# Patient Record
Sex: Female | Born: 1935 | Race: White | Hispanic: No | Marital: Married | Smoking: Never smoker
Health system: Southern US, Community
[De-identification: ages and names within clinical notes are randomized; demographics above are authoritative.]

## PROBLEM LIST (undated history)

## (undated) DIAGNOSIS — M1990 Unspecified osteoarthritis, unspecified site: Secondary | ICD-10-CM

## (undated) DIAGNOSIS — E079 Disorder of thyroid, unspecified: Secondary | ICD-10-CM

## (undated) DIAGNOSIS — M810 Age-related osteoporosis without current pathological fracture: Secondary | ICD-10-CM

## (undated) HISTORY — PX: TONSILLECTOMY: SUR1361

## (undated) HISTORY — PX: ABDOMINAL HYSTERECTOMY: SHX81

## (undated) HISTORY — PX: BLADDER TUMOR EXCISION: SHX238

---

## 2016-01-15 ENCOUNTER — Encounter (HOSPITAL_BASED_OUTPATIENT_CLINIC_OR_DEPARTMENT_OTHER): Payer: Self-pay | Admitting: Emergency Medicine

## 2016-01-15 ENCOUNTER — Emergency Department (HOSPITAL_BASED_OUTPATIENT_CLINIC_OR_DEPARTMENT_OTHER): Payer: Medicare Other

## 2016-01-15 ENCOUNTER — Emergency Department (HOSPITAL_BASED_OUTPATIENT_CLINIC_OR_DEPARTMENT_OTHER)
Admission: EM | Admit: 2016-01-15 | Discharge: 2016-01-15 | Disposition: A | Discharge status: Home / Self Care | Payer: Medicare Other | Attending: Emergency Medicine | Admitting: Emergency Medicine

## 2016-01-15 DIAGNOSIS — Y999 Unspecified external cause status: Secondary | ICD-10-CM | POA: Diagnosis not present

## 2016-01-15 DIAGNOSIS — Z79899 Other long term (current) drug therapy: Secondary | ICD-10-CM | POA: Diagnosis not present

## 2016-01-15 DIAGNOSIS — S81812A Laceration without foreign body, left lower leg, initial encounter: Secondary | ICD-10-CM | POA: Diagnosis not present

## 2016-01-15 DIAGNOSIS — M1990 Unspecified osteoarthritis, unspecified site: Secondary | ICD-10-CM | POA: Diagnosis not present

## 2016-01-15 DIAGNOSIS — S8992XA Unspecified injury of left lower leg, initial encounter: Secondary | ICD-10-CM | POA: Diagnosis present

## 2016-01-15 DIAGNOSIS — Y939 Activity, unspecified: Secondary | ICD-10-CM | POA: Diagnosis not present

## 2016-01-15 DIAGNOSIS — W19XXXA Unspecified fall, initial encounter: Secondary | ICD-10-CM

## 2016-01-15 DIAGNOSIS — Y929 Unspecified place or not applicable: Secondary | ICD-10-CM | POA: Diagnosis not present

## 2016-01-15 DIAGNOSIS — W1839XA Other fall on same level, initial encounter: Secondary | ICD-10-CM | POA: Diagnosis not present

## 2016-01-15 DIAGNOSIS — M79605 Pain in left leg: Secondary | ICD-10-CM

## 2016-01-15 HISTORY — DX: Disorder of thyroid, unspecified: E07.9

## 2016-01-15 HISTORY — DX: Unspecified osteoarthritis, unspecified site: M19.90

## 2016-01-15 HISTORY — DX: Age-related osteoporosis without current pathological fracture: M81.0

## 2016-01-15 MED ORDER — CEPHALEXIN 500 MG PO CAPS: 500.0000 mg | ORAL_CAPSULE | Freq: Three times a day (TID) | ORAL | Status: AC

## 2016-01-15 MED ORDER — LIDOCAINE HCL (PF) 1 % IJ SOLN: 30.0000 mL | Freq: Once | INTRAMUSCULAR | Status: DC

## 2016-01-15 MED ORDER — TETANUS-DIPHTH-ACELL PERTUSSIS 5-2.5-18.5 LF-MCG/0.5 IM SUSP
0.5000 mL | Freq: Once | INTRAMUSCULAR | Status: AC
Start: 2016-01-15 — End: 2016-01-15
  Administered 2016-01-15: 0.5 mL via INTRAMUSCULAR
  Filled 2016-01-15: qty 0.5

## 2016-01-15 MED ORDER — LIDOCAINE-EPINEPHRINE (PF) 1 %-1:200000 IJ SOLN
INTRAMUSCULAR | Status: AC
  Filled 2016-01-15: qty 30

## 2016-01-15 NOTE — ED Notes (Signed)
Patient transported to X-ray 

## 2016-01-15 NOTE — ED Notes (Signed)
MD at bedside. 

## 2016-01-15 NOTE — Discharge Instructions (Signed)
Medications: Keflex  Treatment: Take Keflex 3 times daily for 1 week. Keep the bandage given today on until this time tomorrow. At this time, wash with warm soapy water and apply bacitracin and clean dressing. Do this daily until you have your staples removed in 10-14 days.  Follow-up: Please see your primary care provider or return to the emergency department in 10-14 days to have your sutures removed. Be aware of any signs of infection including fever, increasing redness, swelling, drainage, streaking from the area. If any of these symptoms develop, please call your doctor or return to the emergency department immediately.   Laceration Care, Adult A laceration is a cut that goes through all of the layers of the skin and into the tissue that is right under the skin. Some lacerations heal on their own. Others need to be closed with stitches (sutures), staples, skin adhesive strips, or skin glue. Proper laceration care minimizes the risk of infection and helps the laceration to heal better. HOW TO CARE FOR YOUR LACERATION If sutures or staples were used:  Keep the wound clean and dry.  If you were given a bandage (dressing), you should change it at least one time per day or as told by your health care provider. You should also change it if it becomes wet or dirty.  Keep the wound completely dry for the first 24 hours or as told by your health care provider. After that time, you may shower or bathe. However, make sure that the wound is not soaked in water until after the sutures or staples have been removed.  Clean the wound one time each day or as told by your health care provider:  Wash the wound with soap and water.  Rinse the wound with water to remove all soap.  Pat the wound dry with a clean towel. Do not rub the wound.  After cleaning the wound, apply a thin layer of antibiotic ointmentas told by your health care provider. This will help to prevent infection and keep the dressing from  sticking to the wound.  Have the sutures or staples removed as told by your health care provider. If skin adhesive strips were used:  Keep the wound clean and dry.  If you were given a bandage (dressing), you should change it at least one time per day or as told by your health care provider. You should also change it if it becomes dirty or wet.  Do not get the skin adhesive strips wet. You may shower or bathe, but be careful to keep the wound dry.  If the wound gets wet, pat it dry with a clean towel. Do not rub the wound.  Skin adhesive strips fall off on their own. You may trim the strips as the wound heals. Do not remove skin adhesive strips that are still stuck to the wound. They will fall off in time. If skin glue was used:  Try to keep the wound dry, but you may briefly wet it in the shower or bath. Do not soak the wound in water, such as by swimming.  After you have showered or bathed, gently pat the wound dry with a clean towel. Do not rub the wound.  Do not do any activities that will make you sweat heavily until the skin glue has fallen off on its own.  Do not apply liquid, cream, or ointment medicine to the wound while the skin glue is in place. Using those may loosen the film before the  wound has healed.  If you were given a bandage (dressing), you should change it at least one time per day or as told by your health care provider. You should also change it if it becomes dirty or wet.  If a dressing is placed over the wound, be careful not to apply tape directly over the skin glue. Doing that may cause the glue to be pulled off before the wound has healed.  Do not pick at the glue. The skin glue usually remains in place for 5-10 days, then it falls off of the skin. General Instructions  Take over-the-counter and prescription medicines only as told by your health care provider.  If you were prescribed an antibiotic medicine or ointment, take or apply it as told by your  doctor. Do not stop using it even if your condition improves.  To help prevent scarring, make sure to cover your wound with sunscreen whenever you are outside after stitches are removed, after adhesive strips are removed, or when glue remains in place and the wound is healed. Make sure to wear a sunscreen of at least 30 SPF.  Do not scratch or pick at the wound.  Keep all follow-up visits as told by your health care provider. This is important.  Check your wound every day for signs of infection. Watch for:  Redness, swelling, or pain.  Fluid, blood, or pus.  Raise (elevate) the injured area above the level of your heart while you are sitting or lying down, if possible. SEEK MEDICAL CARE IF:  You received a tetanus shot and you have swelling, severe pain, redness, or bleeding at the injection site.  You have a fever.  A wound that was closed breaks open.  You notice a bad smell coming from your wound or your dressing.  You notice something coming out of the wound, such as wood or glass.  Your pain is not controlled with medicine.  You have increased redness, swelling, or pain at the site of your wound.  You have fluid, blood, or pus coming from your wound.  You notice a change in the color of your skin near your wound.  You need to change the dressing frequently due to fluid, blood, or pus draining from the wound.  You develop a new rash.  You develop numbness around the wound. SEEK IMMEDIATE MEDICAL CARE IF:  You develop severe swelling around the wound.  Your pain suddenly increases and is severe.  You develop painful lumps near the wound or on skin that is anywhere on your body.  You have a red streak going away from your wound.  The wound is on your hand or foot and you cannot properly move a finger or toe.  The wound is on your hand or foot and you notice that your fingers or toes look pale or bluish.   This information is not intended to replace advice given  to you by your health care provider. Make sure you discuss any questions you have with your health care provider.   Document Released: 06/13/2005 Document Revised: 10/28/2014 Document Reviewed: 06/09/2014 Elsevier Interactive Patient Education [employer]2016 [employer]

## 2016-01-15 NOTE — ED Notes (Signed)
Pt with left lower leg injury, bleeding through gauze at triage. States she lost her balance and fell hitting a chair. Pt a/o denies being on blood thinners or LOC.

## 2016-01-15 NOTE — ED Provider Notes (Signed)
CSN: [id number]     Arrival date & time 01/15/16  1655 History   By signing my name below, I, Linna Darner, attest that this documentation has been prepared under the direction and in the presence of non-physician practitioner, Buel Ream, PA-C. Electronically Signed: Linna Darner, Scribe. 01/15/2016. 5:22 PM.     Chief Complaint  Patient presents with  . Fall  . Leg Injury    The history is provided by the patient. No language interpreter was used.     HPI Comments: Desiree Perkins is a 82 y.o. female brought in by EMS, with PMHx of osteoporosis and arthritis, who presents to the Emergency Department complaining of left lower leg injury s/p mechanical fall occurring about an hour and a half ago. Pt states she bent down to get utensils from a bottom cabinet, stood up, lost her balance, fell, and struck her left lower leg on the leg of a chair. Pt notes she is still bleeding from the wounded area currently but notes her bleeding has improved since the incident. Pt endorses mild pain in the area now but states she has been experiencing sharp "tinges" of pain in her left lower leg intermittently since onset. She reports significant swelling of the injured area as well. Pt endorses pain exacerbation with palpation to the wounded area. Pt also notes some pain in her left foot with palpation to her left foot. She notes that she was able to ambulate to the EMS van PTA and did not experience much pain with ambulating. She did not clean the wound PTA and states she tried to bandage the area, but the bandage was too irritating. Pt is unsure of her tetanus status. She does not use blood thinners. Her last PO was between 12PM and 1PM. She denies neuro deficits, numbness, dizziness, lightheadedness, abdominal pain, nausea, vomiting, chest pain, SOB, syncope, dysuria, or any other associated symptoms.  Past Medical History  Diagnosis Date  . Osteoporosis   . Thyroid disease   . Arthritis    Past Surgical  History  Procedure Laterality Date  . Bladder tumor excision    . Cesarean section    . Abdominal hysterectomy    . Tonsillectomy     No family history on file. Social History  Substance Use Topics  . Smoking status: Never Smoker   . Smokeless tobacco: None  . Alcohol Use: No   OB History    No data available     Review of Systems  Respiratory: Negative for shortness of breath.   Cardiovascular: Negative for chest pain.  Gastrointestinal: Negative for nausea, vomiting and abdominal pain.  Genitourinary: Negative for dysuria.  Musculoskeletal: Positive for myalgias (left lower leg, left foot) and joint swelling (left lower leg). Negative for back pain.  Skin: Positive for wound (laceration left lower leg). Negative for rash.  Neurological: Negative for dizziness, syncope, light-headedness and numbness.       Negative for sensation loss.  Psychiatric/Behavioral: The patient is not nervous/anxious.     Allergies  Review of patient's allergies indicates no known allergies.  Home Medications   Prior to Admission medications   Medication Sig Start Date End Date Taking? Authorizing Provider  diclofenac sodium (VOLTAREN) 1 % GEL Apply topically 4 (four) times daily.   Yes Historical Provider, MD  levothyroxine (SYNTHROID, LEVOTHROID) 50 MCG tablet Take 50 mcg by mouth daily before breakfast.   Yes Historical Provider, MD   BP 183/75 mmHg  Pulse 84  Temp(Src) 98.2 F (36.8 C)  Resp 20  Ht 5\' 5"  (1.651 m)  Wt 170 lb (77.111 kg)  BMI 28.29 kg/m2  SpO2 97% Physical Exam  Constitutional: She appears well-developed and well-nourished. No distress.  HENT:  Head: Normocephalic and atraumatic.  Mouth/Throat: Oropharynx is clear and moist. No oropharyngeal exudate.  Eyes: Conjunctivae are normal. Pupils are equal, round, and reactive to light. Right eye exhibits no discharge. Left eye exhibits no discharge. No scleral icterus.  Neck: Normal range of motion. Neck supple. No  thyromegaly present.  Cardiovascular: Normal rate, regular rhythm, normal heart sounds and intact distal pulses.  Exam reveals no gallop and no friction rub.   No murmur heard. Pulmonary/Chest: Effort normal and breath sounds normal. No stridor. No respiratory distress. She has no wheezes. She has no rales.  Abdominal: Soft. Bowel sounds are normal. She exhibits no distension. There is no tenderness. There is no rebound and no guarding.  Musculoskeletal: She exhibits no edema.  Tenderness about dorsal left foot, bilateral malleoli, and anterior lower leg. ~12 cm laceration to lateral distal lower leg, proximal to ankle 10 x 5 x 2 (height) cm hematoma to lateral left lower leg proximal to ankle.  Lymphadenopathy:    She has no cervical adenopathy.  Neurological: She is alert. Coordination normal.  5/5 strength in bilateral lower extremities. Normal sensation of left lower extremity.  Skin: Skin is warm and dry. No rash noted. She is not diaphoretic. No pallor.  Psychiatric: She has a normal mood and affect.  Nursing note and vitals reviewed.   ED Course  .Marland Kitchen.Laceration Repair Date/Time: 01/16/2016 1:08 AM Performed by: Emi HolesLAW, Stefania Goulart M Authorized by: Emi HolesLAW, Yassir Enis M Consent: Verbal consent obtained. Consent given by: patient Patient identity confirmed: verbally with patient Body area: lower extremity Location details: left lower leg Laceration length: 12 cm Foreign bodies: no foreign bodies Tendon involvement: none Nerve involvement: none Vascular damage: no Local anesthetic: lidocaine 1% with epinephrine Anesthetic total: 11 ml Patient sedated: no Irrigation solution: saline Irrigation method: syringe Amount of cleaning: extensive Debridement: none Degree of undermining: none Wound skin closure material used: staples. Number of sutures: 19 (staples) Suture technique: staples and steri strips. Approximation: close Approximation difficulty: complex (due to very thin  skin) Dressing: gauze roll and antibiotic ointment Patient tolerance: Patient tolerated the procedure well with no immediate complications   (including critical care time)  DIAGNOSTIC STUDIES: Oxygen Saturation is 97% on RA, normal by my interpretation.    COORDINATION OF CARE: 5:22 PM Discussed treatment plan with pt at bedside and pt agreed to plan.  Labs Review Labs Reviewed - No data to display  Imaging Review No results found. I have personally reviewed and evaluated these images and lab results as part of my medical decision-making.   EKG Interpretation None      MDM   Tetanus updated in ED. Laceration occurred < 12 hours prior to repair. Laceration repaired with staples following hematoma evacuation and extensive syringe irrigation. Discussed laceration care with pt and answered questions. Pt to f-u for staple removal in 10-14 days and wound check sooner should there be signs of dehiscence or infection. Patient discharged with Keflex for prophylaxis. Pt is hemodynamically stable with no complaints prior to dc.  Patient also evaluated by Dr. Fredderick PhenixBelfi who guided the patient's management and is in agreement with plan.   Final diagnoses:  Fall  Left leg pain  Leg laceration, left, initial encounter   I personally performed the services described in this documentation, which was scribed in  my presence. The recorded information has been reviewed and is accurate.   [address] PA-C 01/16/16 0110   Rolan Bucco, MD 01/18/16 [phone]

## 2019-11-28 ENCOUNTER — Encounter (HOSPITAL_BASED_OUTPATIENT_CLINIC_OR_DEPARTMENT_OTHER): Payer: Self-pay | Admitting: *Deleted

## 2019-11-28 ENCOUNTER — Other Ambulatory Visit: Payer: Self-pay

## 2019-11-28 ENCOUNTER — Emergency Department (HOSPITAL_BASED_OUTPATIENT_CLINIC_OR_DEPARTMENT_OTHER): Payer: Medicare Other

## 2019-11-28 ENCOUNTER — Emergency Department (HOSPITAL_BASED_OUTPATIENT_CLINIC_OR_DEPARTMENT_OTHER)
Admission: EM | Admit: 2019-11-28 | Discharge: 2019-11-28 | Disposition: A | Discharge status: Home / Self Care | Payer: Medicare Other | Attending: Emergency Medicine | Admitting: Emergency Medicine

## 2019-11-28 DIAGNOSIS — Z79899 Other long term (current) drug therapy: Secondary | ICD-10-CM | POA: Insufficient documentation

## 2019-11-28 DIAGNOSIS — Y999 Unspecified external cause status: Secondary | ICD-10-CM | POA: Diagnosis not present

## 2019-11-28 DIAGNOSIS — W01198A Fall on same level from slipping, tripping and stumbling with subsequent striking against other object, initial encounter: Secondary | ICD-10-CM | POA: Insufficient documentation

## 2019-11-28 DIAGNOSIS — Y9389 Activity, other specified: Secondary | ICD-10-CM | POA: Diagnosis not present

## 2019-11-28 DIAGNOSIS — Y92003 Bedroom of unspecified non-institutional (private) residence as the place of occurrence of the external cause: Secondary | ICD-10-CM | POA: Diagnosis not present

## 2019-11-28 DIAGNOSIS — S51011A Laceration without foreign body of right elbow, initial encounter: Secondary | ICD-10-CM | POA: Insufficient documentation

## 2019-11-28 DIAGNOSIS — E079 Disorder of thyroid, unspecified: Secondary | ICD-10-CM | POA: Insufficient documentation

## 2019-11-28 DIAGNOSIS — S41011A Laceration without foreign body of right shoulder, initial encounter: Secondary | ICD-10-CM | POA: Diagnosis not present

## 2019-11-28 DIAGNOSIS — W19XXXA Unspecified fall, initial encounter: Secondary | ICD-10-CM

## 2019-11-28 DIAGNOSIS — S0003XA Contusion of scalp, initial encounter: Secondary | ICD-10-CM | POA: Insufficient documentation

## 2019-11-28 DIAGNOSIS — T148XXA Other injury of unspecified body region, initial encounter: Secondary | ICD-10-CM

## 2019-11-28 DIAGNOSIS — S0990XA Unspecified injury of head, initial encounter: Secondary | ICD-10-CM | POA: Diagnosis present

## 2019-11-28 MED ORDER — ACETAMINOPHEN 325 MG PO TABS
650.0000 mg | ORAL_TABLET | Freq: Once | ORAL | Status: AC
  Administered 2019-11-28: 650 mg via ORAL
  Filled 2019-11-28: qty 2

## 2019-11-28 NOTE — ED Provider Notes (Signed)
[facility] EMERGENCY DEPARTMENT Provider Note   CSN: [id number] Arrival date & time: 11/28/19  1047     History Chief Complaint  Patient presents with  . Fall    Desiree Perkins is a 86 y.o. female.  The history is provided by the patient, medical records and a relative.  Fall This is a new problem. The current episode started 6 to 12 hours ago. The problem occurs rarely. The problem has been resolved. Associated symptoms include headaches. Pertinent negatives include no chest pain, no abdominal pain and no shortness of breath. Nothing aggravates the symptoms. Nothing relieves the symptoms. She has tried nothing for the symptoms. The treatment provided no relief.       Past Medical History:  Diagnosis Date  . Arthritis   . Osteoporosis   . Thyroid disease     There are no problems to display for this patient.   Past Surgical History:  Procedure Laterality Date  . ABDOMINAL HYSTERECTOMY    . BLADDER TUMOR EXCISION    . CESAREAN SECTION    . TONSILLECTOMY       OB History   No obstetric history on file.     No family history on file.  Social History   Tobacco Use  . Smoking status: Never Smoker  Substance Use Topics  . Alcohol use: No  . Drug use: No    Home Medications Prior to Admission medications   Medication Sig Start Date End Date Taking? Authorizing Provider  cephALEXin (KEFLEX) 500 MG capsule Take 1 capsule (500 mg total) by mouth 3 (three) times daily. 01/15/16   Emi Holes, PA-C  diclofenac sodium (VOLTAREN) 1 % GEL Apply topically 4 (four) times daily.    [provider]  levothyroxine (SYNTHROID, LEVOTHROID) 50 MCG tablet Take 50 mcg by mouth daily before breakfast.    [provider]    Allergies    Patient has no known allergies.  Review of Systems   Review of Systems  Constitutional: Negative for activity change, chills, diaphoresis, fatigue and fever.  HENT: Negative for congestion and rhinorrhea.     Eyes: Negative for visual disturbance.  Respiratory: Negative for cough, chest tightness, shortness of breath and stridor.   Cardiovascular: Negative for chest pain, palpitations and leg swelling.  Gastrointestinal: Negative for abdominal distention, abdominal pain, constipation, diarrhea, nausea and vomiting.  Genitourinary: Negative for difficulty urinating, dysuria, flank pain, frequency, hematuria, menstrual problem, pelvic pain, vaginal bleeding and vaginal discharge.  Musculoskeletal: Negative for back pain, neck pain and neck stiffness.  Skin: Positive for wound. Negative for rash.  Neurological: Positive for headaches. Negative for dizziness, weakness, light-headedness and numbness.  Psychiatric/Behavioral: Negative for agitation and confusion.  All other systems reviewed and are negative.   Physical Exam Updated Vital Signs BP (!) 150/69   Pulse 70   Temp 99 F (37.2 C) (Oral)   Resp 16   Ht 5\' 2"  (1.575 m)   SpO2 100%   BMI 31.09 kg/m   Physical Exam Vitals and nursing note reviewed.  Constitutional:      General: She is not in acute distress.    Appearance: She is well-developed. She is not ill-appearing, toxic-appearing or diaphoretic.  HENT:     Head:      Right Ear: External ear normal.     Left Ear: External ear normal.     Nose: Nose normal. No congestion or rhinorrhea.     Mouth/Throat:     Mouth: Mucous membranes  are moist.     Pharynx: No oropharyngeal exudate or posterior oropharyngeal erythema.  Eyes:     Conjunctiva/sclera: Conjunctivae normal.     Pupils: Pupils are equal, round, and reactive to light.  Cardiovascular:     Rate and Rhythm: Normal rate.     Pulses: Normal pulses.     Heart sounds: No murmur.  Pulmonary:     Effort: Pulmonary effort is normal. No respiratory distress.     Breath sounds: No stridor. No wheezing, rhonchi or rales.  Chest:     Chest wall: No tenderness.  Abdominal:     General: Abdomen is flat. There is no  distension.     Tenderness: There is no abdominal tenderness. There is no right CVA tenderness, left CVA tenderness or rebound.  Musculoskeletal:        General: Tenderness present.       Arms:     Cervical back: Normal range of motion and neck supple. No tenderness.       Back:     Right lower leg: No edema.     Left lower leg: No edema.  Skin:    General: Skin is warm.     Capillary Refill: Capillary refill takes less than 2 seconds.     Coloration: Skin is not pale.     Findings: No erythema or rash.  Neurological:     General: No focal deficit present.     Mental Status: She is alert and oriented to person, place, and time.     Cranial Nerves: No cranial nerve deficit.     Sensory: No sensory deficit.     Motor: No weakness or abnormal muscle tone.     Coordination: Coordination normal.     Deep Tendon Reflexes: Reflexes are normal and symmetric.  Psychiatric:        Mood and Affect: Mood normal.     ED Results / Procedures / Treatments   Labs (all labs ordered are listed, but only abnormal results are displayed) Labs Reviewed - No data to display  EKG None  Radiology DG Chest 2 View  Result Date: 11/28/2019 CLINICAL DATA:  Fall, right upper back/scapula pain EXAM: CHEST - 2 VIEW COMPARISON:  01/19/2017 FINDINGS: Right-sided chest port terminates at the level of the superior cavoatrial junction. The heart size and mediastinal contours are stable. Atherosclerotic calcification of the aortic knob. No focal airspace consolidation, pleural effusion, or pneumothorax. The visualized skeletal structures are unremarkable. IMPRESSION: No active cardiopulmonary disease. Electronically Signed   By: Duanne Guess D.O.   On: 11/28/2019 13:14   CT Head Wo Contrast  Result Date: 11/28/2019 CLINICAL DATA:  Posttraumatic headache after fall. EXAM: CT HEAD WITHOUT CONTRAST TECHNIQUE: Contiguous axial images were obtained from the base of the skull through the vertex without intravenous  contrast. COMPARISON:  None. FINDINGS: Brain: Mild diffuse cortical atrophy is noted. Mild chronic ischemic white matter disease is noted. No mass effect or midline shift is noted. Ventricular size is within normal limits. There is no evidence of mass lesion, hemorrhage or acute infarction. Vascular: No hyperdense vessel or unexpected calcification. Skull: Normal. Negative for fracture or focal lesion. Sinuses/Orbits: No acute finding. Other: None. IMPRESSION: Mild diffuse cortical atrophy. Mild chronic ischemic white matter disease. No acute intracranial abnormality seen. Electronically Signed   By: Lupita Raider M.D.   On: 11/28/2019 12:09    Procedures Procedures (including critical care time)  Medications Ordered in ED Medications  acetaminophen (TYLENOL) tablet 650  mg (650 mg Oral Given 11/28/19 1551)    ED Course  I have reviewed the triage vital signs and the nursing notes.  Pertinent labs & imaging results that were available during my care of the patient were reviewed by me and considered in my medical decision making (see chart for details).    MDM Rules/Calculators/A&P                      Desiree Perkins is a 86 y.o. female with a past medical history significant for thyroid disease and osteoporosis who presents with a fall.  According to patient, she had a fall earlier today where she hit the back of her head on a door frame and then hit her right elbow and right scapular area on the ground.  He did not lose consciousness.  She reports moderate pain.  She reports no significant nausea, vomiting, or vision changes.  She reports that she had a mechanical fall while she was reaching for the door and her dresser.  She denies any neck pain or neck stiffness.  Denies any extremity complaints otherwise.  She reports some bleeding from the abrasion and skin tear on her elbow and back.  She denies any pain in her low back or mid back.  No other complaints.  No recent medication changes.  On  exam, lungs are clear and chest is nontender.  Patient has tenderness near a skin tear on her back.  She reports her tetanus is up-to-date.  Symmetric grip strength, sensation, and pulses in upper extremities.  Small skin tear on her right elbow that is hemostatic.  No focal neurologic deficits.  Small bump on the back of her head with no laceration or bleeding.  Patient otherwise well-appearing.  No neck tenderness.  Had a shared decision-making conversation with patient and son and we agreed to get CT head, chest x-ray.  Imaging was reassuring.  No evidence of bleeding or fractures.  Patient will follow up with PCP and understood return precautions.  Patient discharged in good condition.    Final Clinical Impression(s) / ED Diagnoses Final diagnoses:  Fall, initial encounter  Abrasion  Multiple skin tears    Rx / DC Orders ED Discharge Orders    None     Clinical Impression: 1. Fall, initial encounter   2. Abrasion   3. Multiple skin tears     Disposition: Discharge  Condition: Good  I have discussed the results, Dx and Tx plan with the pt(& family if present). He/she/they expressed understanding and agree(s) with the plan. Discharge instructions discussed at great length. Strict return precautions discussed and pt &/or family have verbalized understanding of the instructions. No further questions at time of discharge.    Discharge Medication List as of 11/28/2019  4:30 PM      Follow Up: Karleen Hampshire., MD [address] [place] [address] [phone]     [facility] [address] [id number] [place] [facility] [phone]       Melat Wrisley, Gwenyth Allegra, MD 11/28/19 2006

## 2019-11-28 NOTE — Discharge Instructions (Signed)
Your imaging today was overall reassuring with no evidence of fracture, dislocation, or intracranial bleed.  I suspect there may be a mild concussion given the headache but her neurologic exam otherwise is reassuring.  There were several skin tears and abrasions which were cleaned and dressed.  The chest x-ray did not show any scapular, rib, or lung injury.  Please rest and stay hydrated and follow-up with your primary doctor.  If any symptoms change or worsen, please return to the nearest emergency department.

## 2020-11-24 IMAGING — DX DG CHEST 2V
2 series · 2 of 2 positions shown · non-contrast
Comparison: 01/19/2017

CLINICAL DATA: Fall, right upper back/scapula pain

EXAM:
CHEST - 2 VIEW

[chest pa]
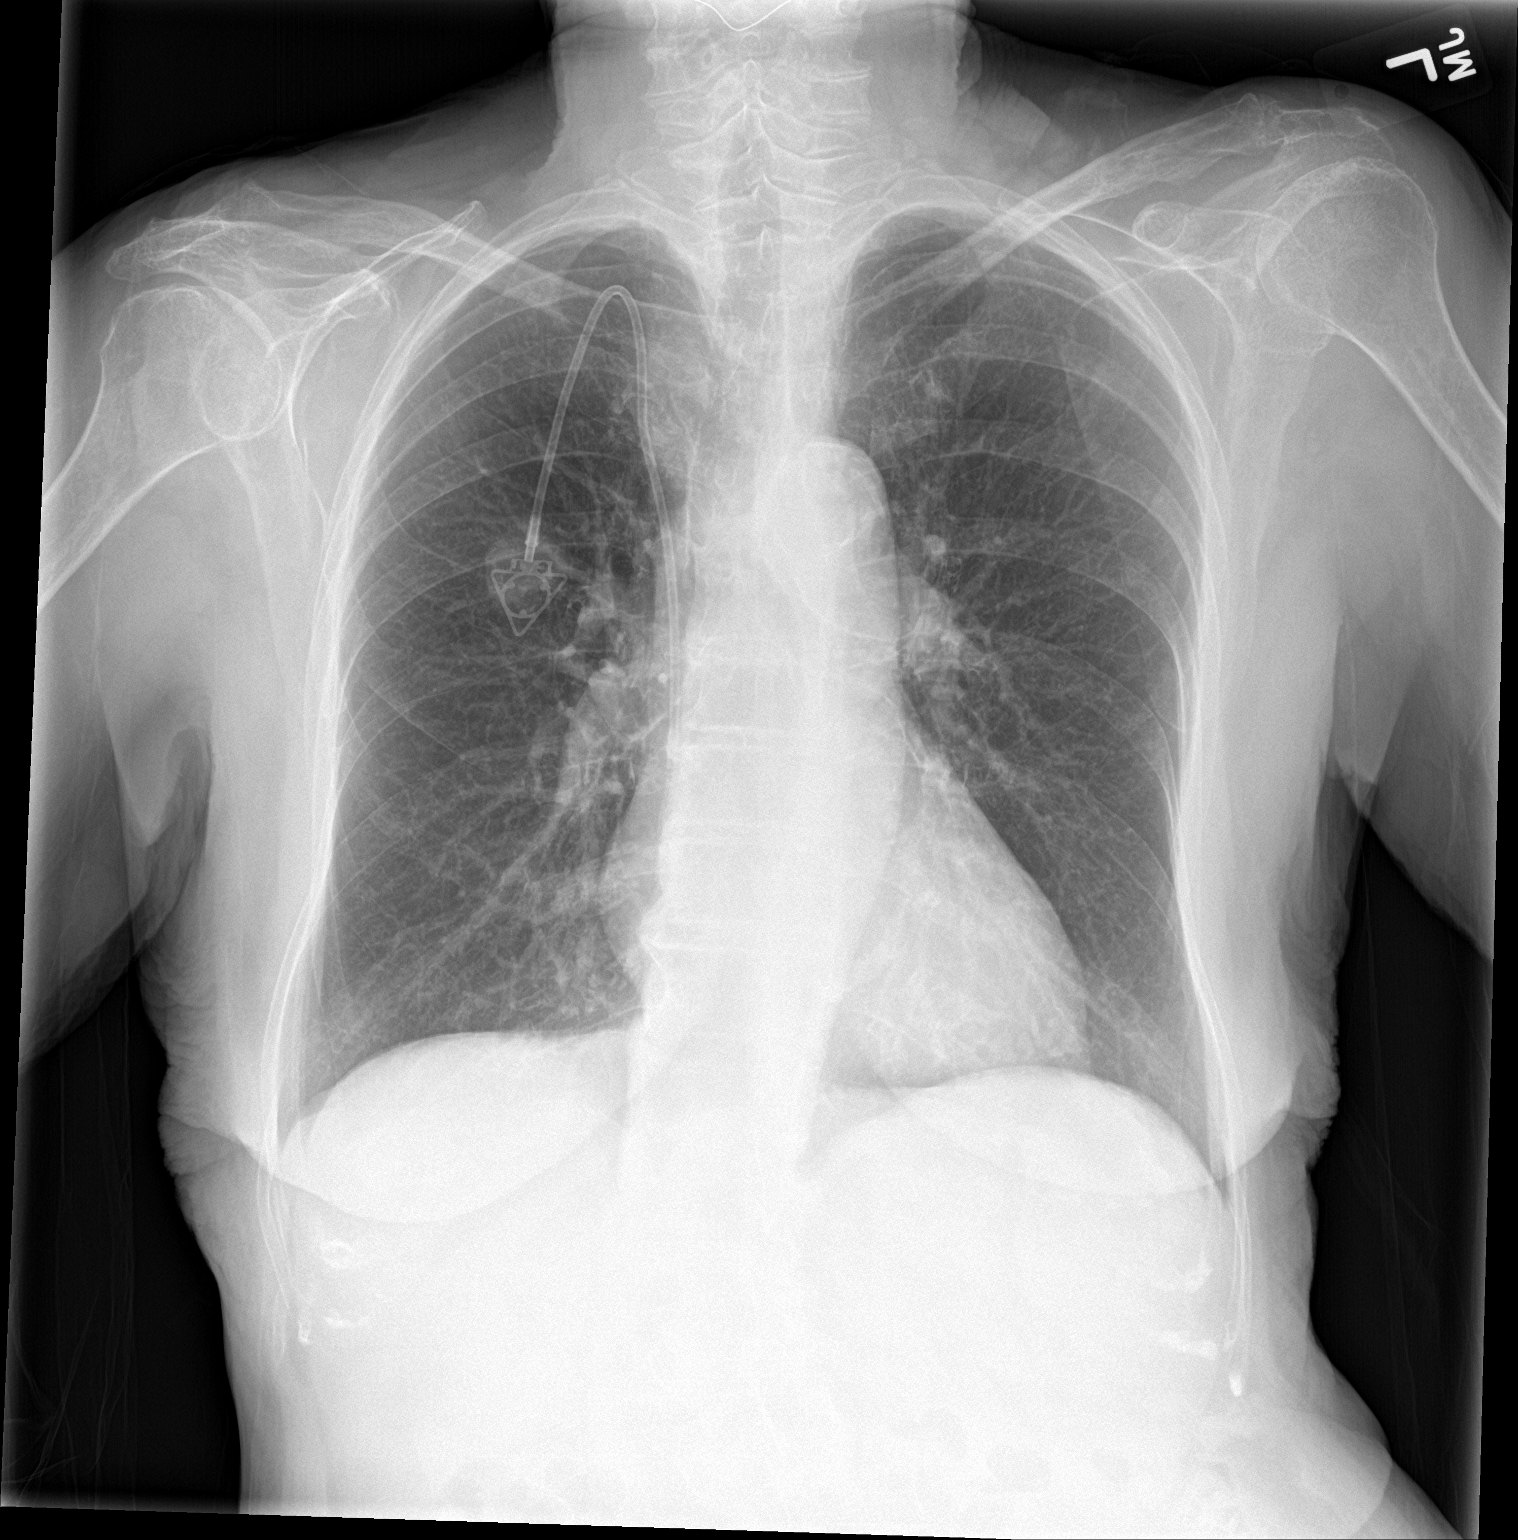

[chest lat]
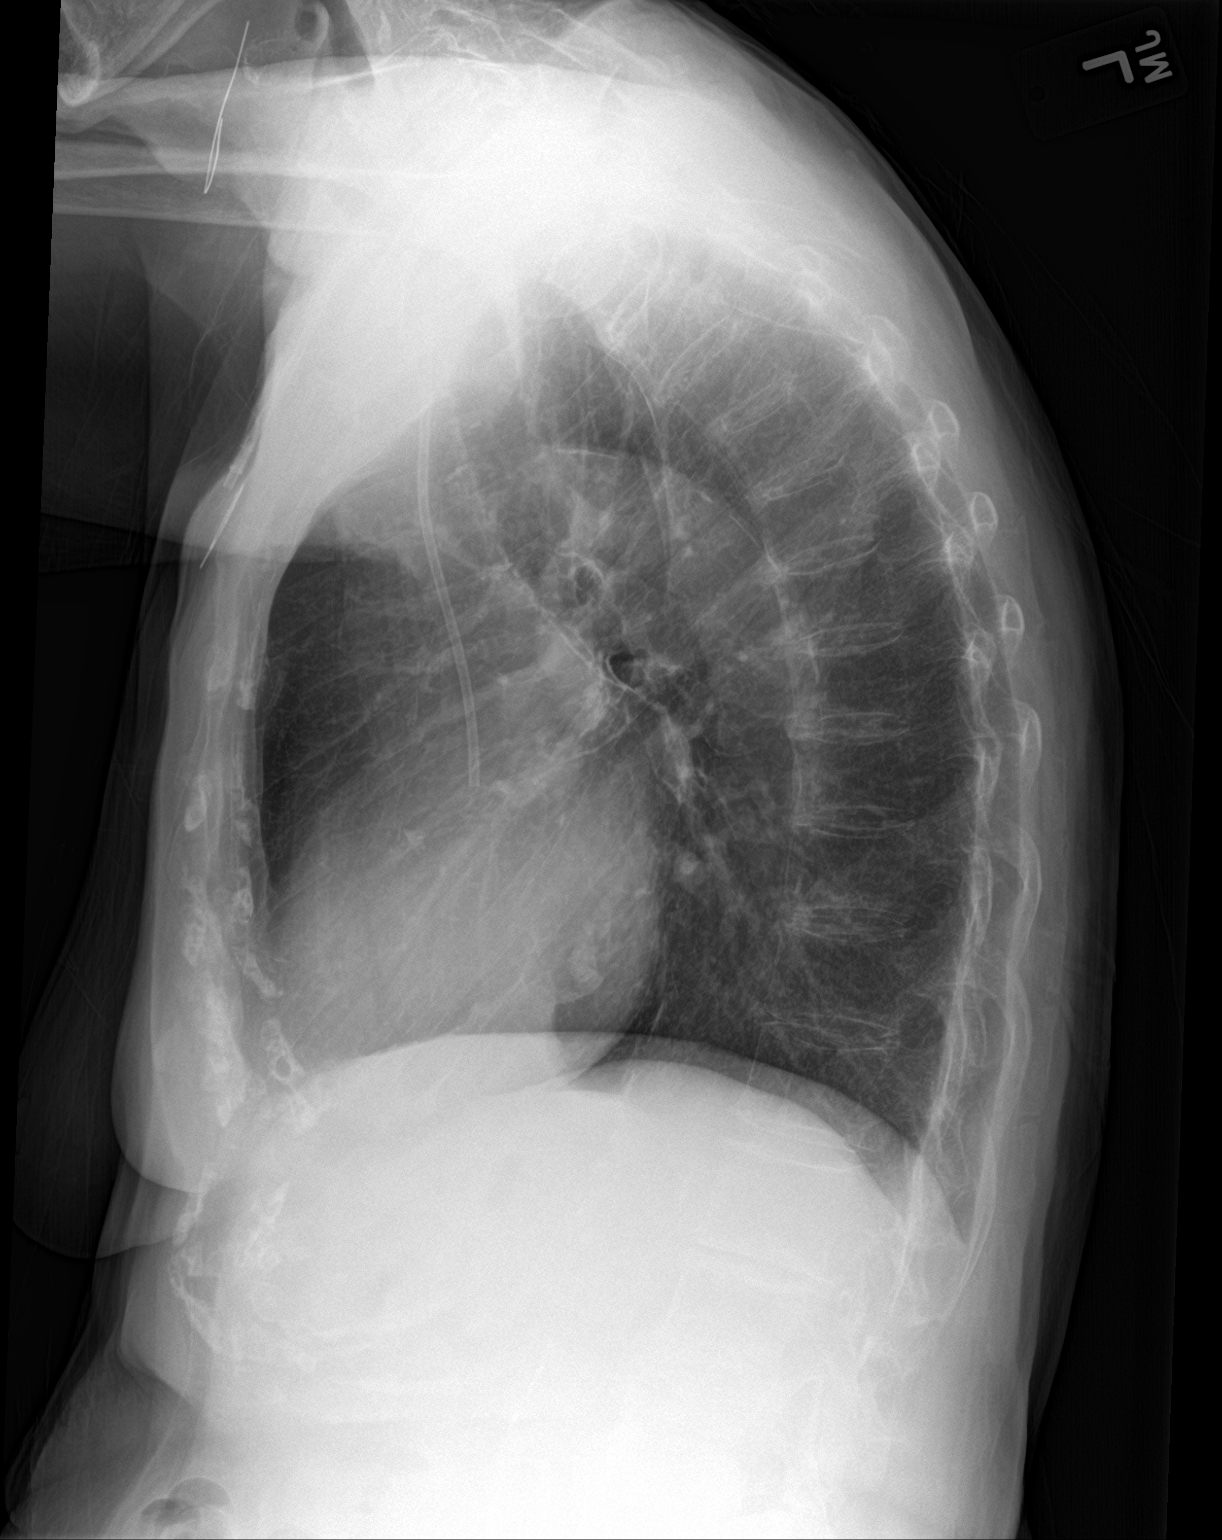

[2 of 2 positions shown; findings below may reference images not displayed]

FINDINGS: Right-sided chest port terminates at the level of the superior
cavoatrial junction. The heart size and mediastinal contours are
stable. Atherosclerotic calcification of the aortic knob. No focal
airspace consolidation, pleural effusion, or pneumothorax. The
visualized skeletal structures are unremarkable.
IMPRESSION: No active cardiopulmonary disease.

## 2020-11-24 IMAGING — CT CT HEAD W/O CM
3 series · 15 of 47 positions shown, 18 images · non-contrast
Comparison: None.

CLINICAL DATA: Posttraumatic headache after fall.

EXAM:
CT HEAD WITHOUT CONTRAST
TECHNIQUE: Contiguous axial images were obtained from the base of the skull
through the vertex without intravenous contrast.

[Series 2: head wo · axial · 0.43mm/px · z∈[-154,-29]mm · 9 of 31 slices shown, 12 images]
[im 3/31  brain]
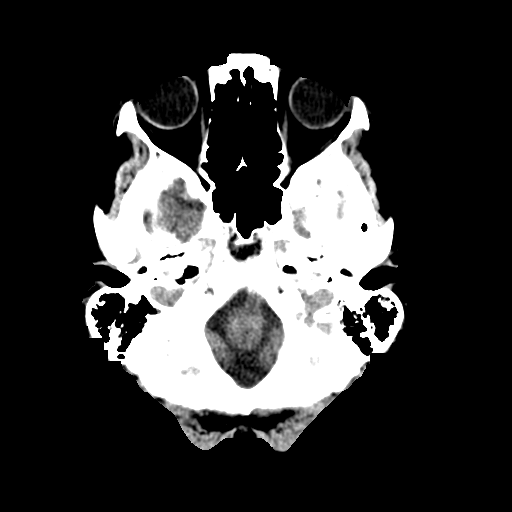
[im 3/31  bone]
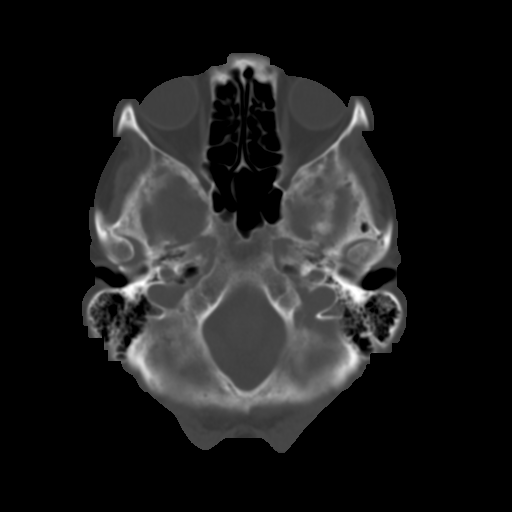
[im 6/31  brain]
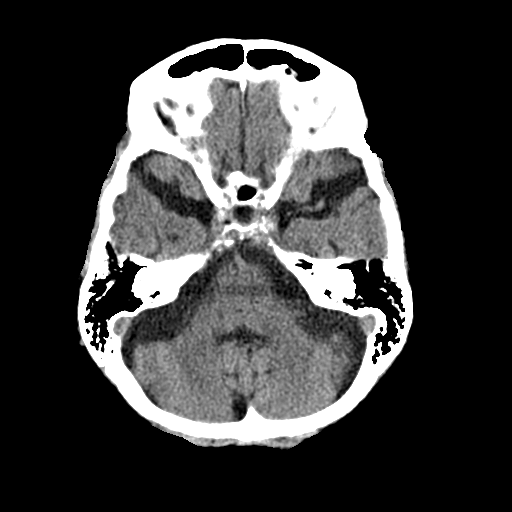
[im 9/31  brain]
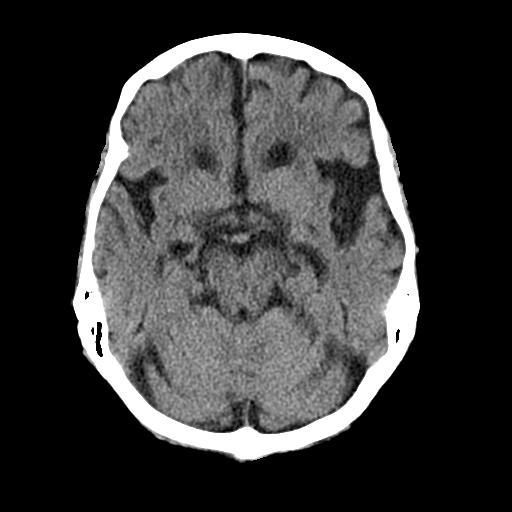
[im 12/31  brain]
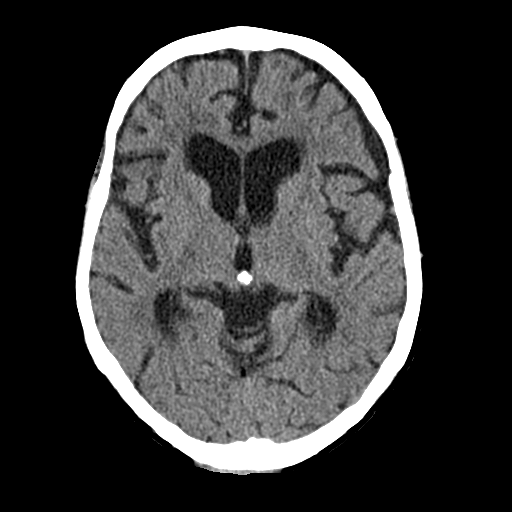
[im 16/31  brain]
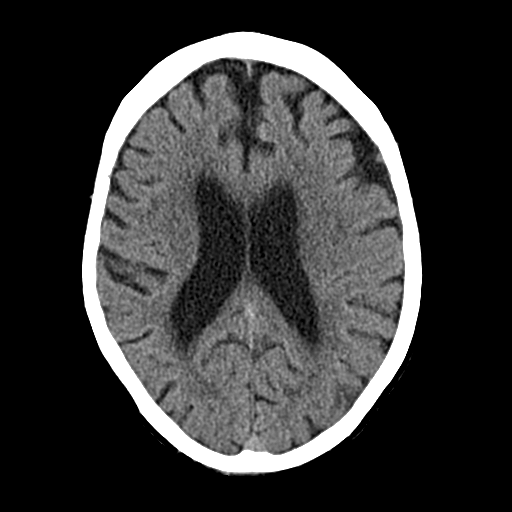
[im 16/31  bone]
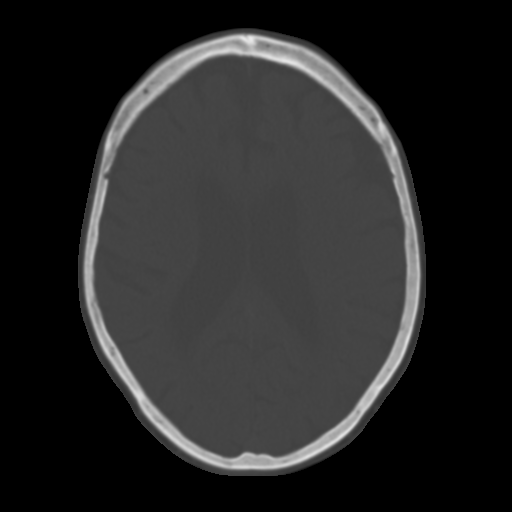
[im 19/31  brain]
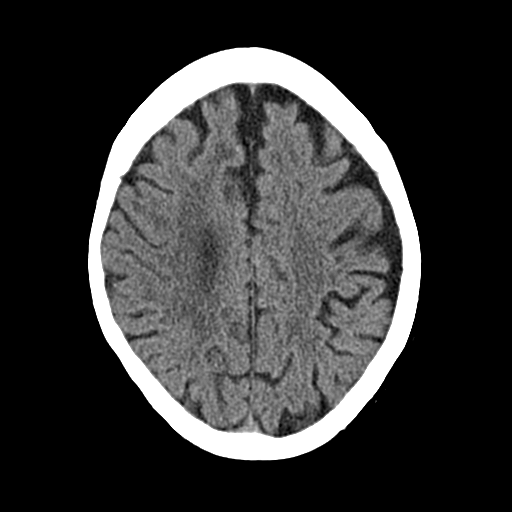
[im 22/31  brain]
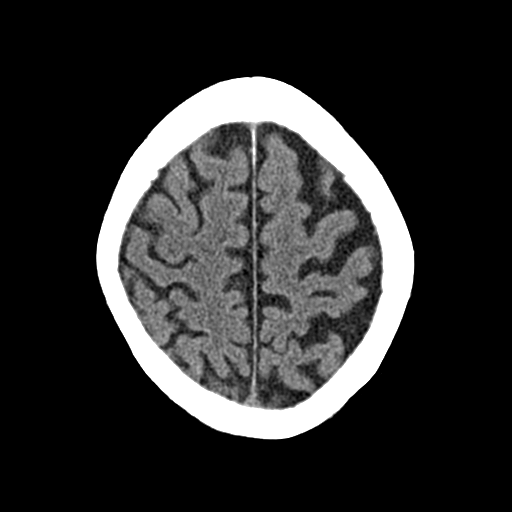
[im 25/31  brain]
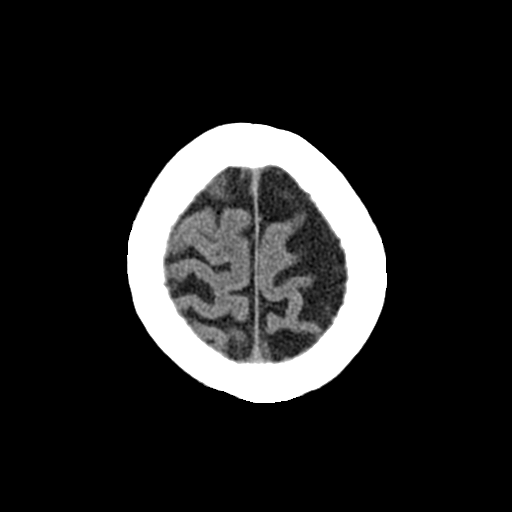
[im 28/31  brain]
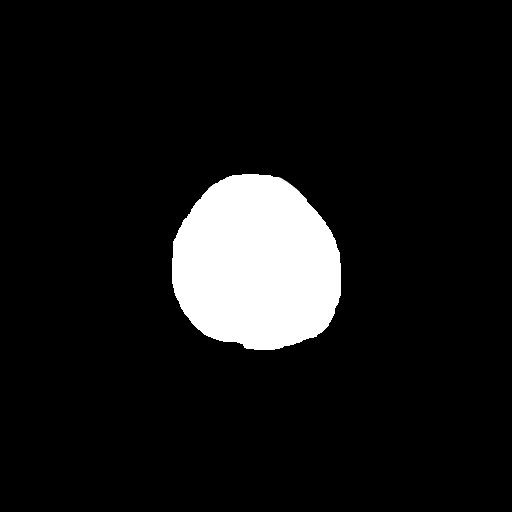
[im 28/31  bone]
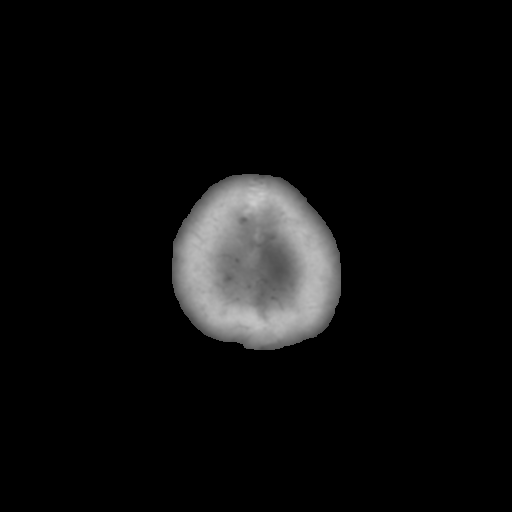

[Series 4: cor soft · coronal · 0.32mm/px · 3 of 70 slices shown]
[im 24/70  brain]
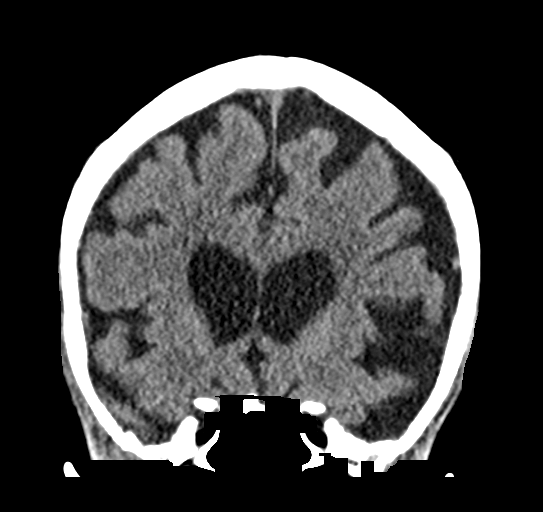
[im 31/70  brain]
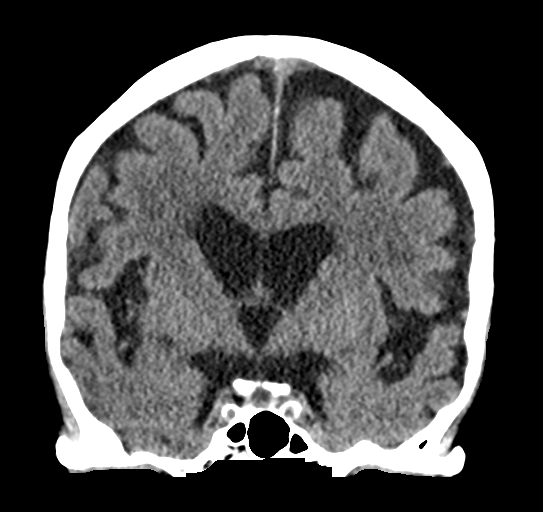
[im 39/70  brain]
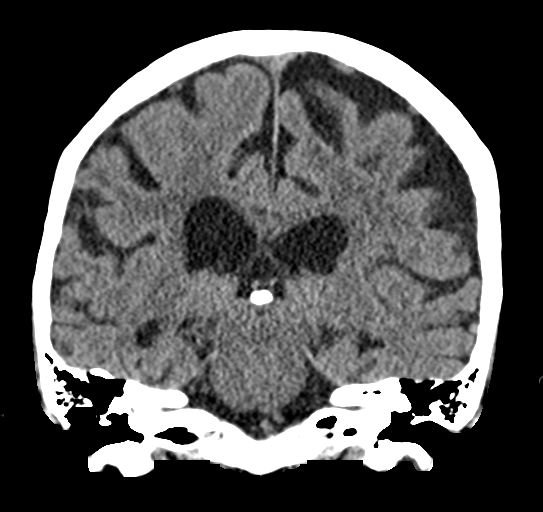

[Series 5: sag soft · sagittal · 0.32mm/px · 3 of 55 slices shown]
[im 19/55  brain]
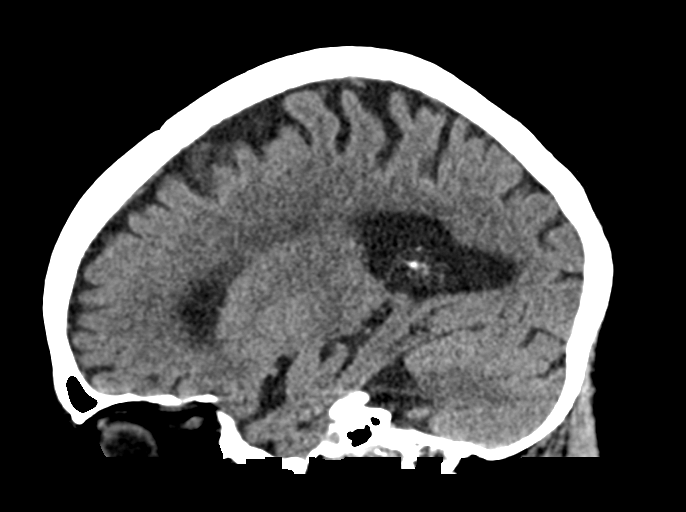
[im 28/55  brain]
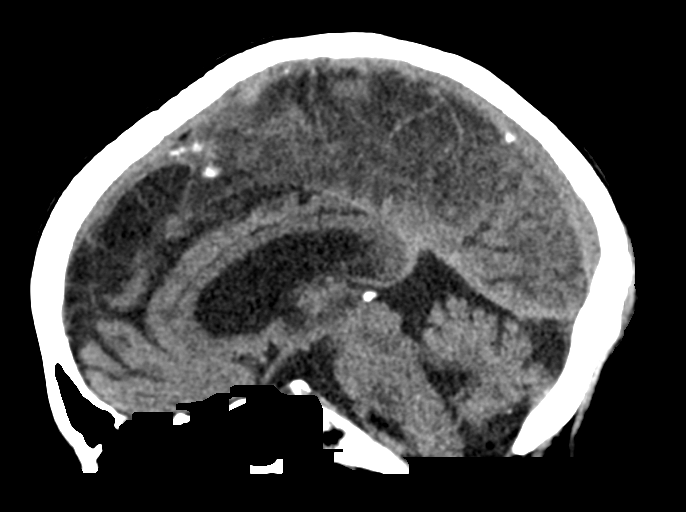
[im 37/55  brain]
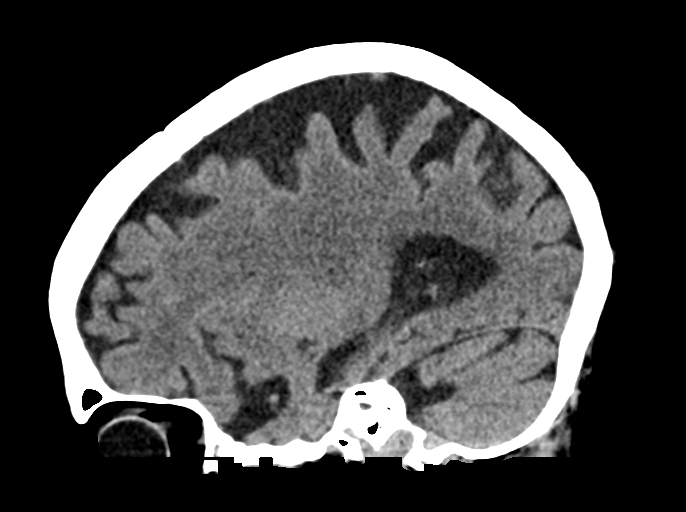

[15 of 47 positions shown; findings below may reference images not displayed]

FINDINGS: Brain: Mild diffuse cortical atrophy is noted. Mild chronic ischemic
white matter disease is noted. No mass effect or midline shift is
noted. Ventricular size is within normal limits. There is no
evidence of mass lesion, hemorrhage or acute infarction.

Vascular: No hyperdense vessel or unexpected calcification.

Skull: Normal. Negative for fracture or focal lesion.

Sinuses/Orbits: No acute finding.

Other: None.
IMPRESSION: Mild diffuse cortical atrophy. Mild chronic ischemic white matter
disease. No acute intracranial abnormality seen.

## 2022-09-26 DEATH — deceased

## 2024-06-03 ENCOUNTER — Other Ambulatory Visit: Payer: Self-pay | Admitting: Oncology

## 2024-06-03 NOTE — Progress Notes (Signed)
 Gynecologic Oncology Multi-Disciplinary Disposition Conference Note  Date of the Conference: 06/03/2024  Patient Name: Desiree Perkins  Referring Provider: Dr. Latisha Primary GYN Oncologist: Dr. 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 Disposition is to CT chest and consideration for a genetic counseling referral followed by observation.   This Multidisciplinary conference took place involving physicians from Gynecologic Oncology, Medical Oncology, Radiation Oncology, Pathology, Radiology along with the Gynecologic Oncology Nurse Practitioner and Gynecologic Oncology Nurse Navigator.  Comprehensive assessment of the patient's malignancy, staging, need for surgery, chemotherapy, radiation therapy, and need for further testing were reviewed. Supportive measures, both inpatient and following discharge were also discussed. The recommended plan of care is documented. Greater than 35 minutes were spent correlating and coordinating this patient's care.
